# Patient Record
Sex: Male | Born: 2005 | Race: White | Hispanic: No | Marital: Single
Health system: Southern US, Community
[De-identification: ages and names within clinical notes are randomized; demographics above are authoritative.]

## PROBLEM LIST (undated history)

## (undated) DIAGNOSIS — H6690 Otitis media, unspecified, unspecified ear: Secondary | ICD-10-CM

## (undated) DIAGNOSIS — R1110 Vomiting, unspecified: Secondary | ICD-10-CM

## (undated) DIAGNOSIS — F988 Other specified behavioral and emotional disorders with onset usually occurring in childhood and adolescence: Secondary | ICD-10-CM

## (undated) DIAGNOSIS — R6250 Unspecified lack of expected normal physiological development in childhood: Secondary | ICD-10-CM

## (undated) DIAGNOSIS — T7840XA Allergy, unspecified, initial encounter: Secondary | ICD-10-CM

## (undated) HISTORY — DX: Vomiting, unspecified: R11.10

## (undated) HISTORY — DX: Allergy, unspecified, initial encounter: T78.40XA

## (undated) HISTORY — DX: Otitis media, unspecified, unspecified ear: H66.90

## (undated) HISTORY — DX: Unspecified lack of expected normal physiological development in childhood: R62.50

---
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
## 2023-04-25 ENCOUNTER — Ambulatory Visit (INDEPENDENT_AMBULATORY_CARE_PROVIDER_SITE_OTHER): Payer: Medicaid Other

## 2023-04-25 ENCOUNTER — Encounter (HOSPITAL_COMMUNITY): Payer: Self-pay

## 2023-04-25 ENCOUNTER — Ambulatory Visit (HOSPITAL_COMMUNITY)
Admission: EM | Admit: 2023-04-25 | Discharge: 2023-04-25 | Disposition: A | Discharge status: Home / Self Care | Payer: Medicaid Other | Attending: Internal Medicine | Admitting: Internal Medicine

## 2023-04-25 DIAGNOSIS — M25511 Pain in right shoulder: Secondary | ICD-10-CM

## 2023-04-25 NOTE — Discharge Instructions (Addendum)
X-ray of the right shoulder ordered and radiology will read the x-ray today but on brief evaluation there does not appear to be any gross abnormalities.  We cannot rule out a tendon or ligament injury and given the decreased range of motion we will use a arm sling and have you follow-up with orthopedics.  Avoid heavy activity with the right arm until orthopedics has cleared you.  Return to urgent care or PCP if symptoms worsen or fail to resolve.

## 2023-04-25 NOTE — ED Triage Notes (Signed)
Patient states he was playing with his sister on the monkey bars a week ago. Patient states he heard his right shoulder pop.  Patient states he hs pain when trying to lift his right arm up.   Patient states he took Ibuprofen, ice and heat with no relief.

## 2023-04-25 NOTE — ED Provider Notes (Signed)
[facility]    CSN: [id number] Arrival date & time: 04/25/23  1210      History   Chief Complaint Chief Complaint  Patient presents with   Shoulder Injury    HPI Terrence [name] is a 17 y.o. adult.   17 year old male who presents to urgent care with complaints of right shoulder pain and decreased range of motion.  He reports about a week ago he was helping with some bars and his arm pulled and twisted causing it to pop.  He immediately developed pain along the anterior and posterior aspects and since then has had decreased range of motion especially with abduction.  He denies any tingling or numbness in the fingers.  He denies any decreased grip strength.  He did not suffer any other injuries.   Shoulder Injury Pertinent negatives include no chest pain, no abdominal pain and no shortness of breath.    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 No pertinent surgical history.  OB History   No obstetric history on file.      Home Medications    Prior to Admission medications   Medication Sig Start Date End Date Taking? Authorizing Provider  lisdexamfetamine (VYVANSE) 40 MG capsule Take 1 capsule (40 mg total) by mouth daily with breakfast. 07/24/19   Leatha Gilding, MD  lisdexamfetamine (VYVANSE) 40 MG capsule Take 1 capsule (40 mg total) by mouth  daily with breakfast. 01/10/20   Leatha Gilding, MD  lisdexamfetamine (VYVANSE) 40 MG capsule Take 1 capsule (40 mg total) by mouth daily with breakfast. 01/10/20   Leatha Gilding, MD  ondansetron (ZOFRAN-ODT) 4 MG disintegrating tablet Take 1 tablet (4 mg total) by mouth every 8 (eight) hours as needed for up to 10 doses for nausea or vomiting. Patient not taking: Reported on 09/22/2018 08/10/18   [place], [place], MD    Family History Family History  Problem Relation Age of Onset   Kidney disease Maternal Grandmother    Hypertension Maternal Grandmother    Hyperlipidemia Maternal Grandmother    Learning disabilities Paternal Uncle    Mental retardation Paternal Uncle     Social History Social History   Tobacco Use   Smoking status: Passive Smoke Exposure - Never Smoker   Smokeless tobacco: Never   Tobacco comments:    mom smokes outside   Vaping Use   Vaping status: Every Day   Substances: Nicotine, Flavoring  Substance Use Topics   Alcohol use: No    Alcohol/week: 0.0 standard drinks of alcohol   Drug use: No     Allergies   Metadate cd [methylphenidate hcl]   Review of Systems Review of Systems  Constitutional:  Negative for chills and fever.  HENT:  Negative for ear pain and sore throat.   Eyes:  Negative for pain and visual  disturbance.  Respiratory:  Negative for cough and shortness of breath.   Cardiovascular:  Negative for chest pain and palpitations.  Gastrointestinal:  Negative for abdominal pain and vomiting.  Genitourinary:  Negative for dysuria and hematuria.  Musculoskeletal:  Negative for arthralgias and back pain.       Right shoulder pain with decreased range of motion  Skin:  Negative for color change and rash.  Neurological:  Negative for seizures and syncope.  All other systems reviewed and are negative.    Physical Exam Triage Vital Signs ED Triage Vitals  Encounter Vitals Group     BP 04/25/23 1257 119/74     Systolic BP Percentile --       Diastolic BP Percentile --      Pulse Rate 04/25/23 1257 77     Resp 04/25/23 1257 16     Temp 04/25/23 1257 98 F (36.7 C)     Temp Source 04/25/23 1257 Oral     SpO2 04/25/23 1257 97 %     Weight --      Height --      Head Circumference --      Peak Flow --      Pain Score 04/25/23 1256 6     Pain Loc --      Pain Education --      Exclude from Growth Chart --    No data found.  Updated Vital Signs BP 119/74 (BP Location: Left Arm)   Pulse 77   Temp 98 F (36.7 C) (Oral)   Resp 16   SpO2 97%   Visual Acuity Right Eye Distance:   Left Eye Distance:   Bilateral Distance:    Right Eye Near:   Left Eye Near:    Bilateral Near:     Physical Exam Vitals and nursing note reviewed.  Constitutional:      General: He is not in acute distress.    Appearance: He is well-developed.  HENT:     Head: Normocephalic and atraumatic.  Eyes:     Conjunctiva/sclera: Conjunctivae normal.  Cardiovascular:     Rate and Rhythm: Normal rate and regular rhythm.     Heart sounds: No murmur heard. Pulmonary:     Effort: Pulmonary effort is normal. No respiratory distress.     Breath sounds: Normal breath sounds.  Abdominal:     Palpations: Abdomen is soft.     Tenderness: There is no abdominal tenderness.  Musculoskeletal:        General: No swelling.     Right shoulder: Tenderness and bony tenderness present. No deformity or crepitus. Decreased range of motion (Especially with rotation and abduction). Normal strength. Normal pulse.     Cervical back: Neck supple.  Skin:    General: Skin is warm and dry.     Capillary Refill: Capillary refill takes less than 2 seconds.  Neurological:     General: No focal deficit present.     Mental Status: He is alert and oriented to person, place, and time.  Psychiatric:        Mood and Affect: Mood normal.      UC Treatments / Results  Labs (all labs ordered are listed, but only abnormal results are displayed) Labs Reviewed - No data  to display  EKG   Radiology No results found.  Procedures Procedures (including critical care time)  Medications Ordered in UC Medications - No data to display  Initial Impression / Assessment and Plan / UC  Course  I have reviewed the triage vital signs and the nursing notes.  Pertinent labs & imaging results that were available during my care of the patient were reviewed by me and considered in my medical decision making (see chart for details).     Acute pain of right shoulder   X-ray of the right shoulder ordered and radiology will read the x-ray today but on brief evaluation there does not appear to be any gross abnormalities.  We cannot rule out a tendon or ligament injury and given the decreased range of motion we will use a arm sling and have you follow-up with orthopedics.  Avoid heavy activity with the right arm until orthopedics has cleared you.  Return to urgent care or PCP if symptoms worsen or fail to resolve.    Final Clinical Impressions(s) / UC Diagnoses   Final diagnoses:  None   Discharge Instructions   None    ED Prescriptions   None    PDMP not reviewed this encounter.   Landis Martins, [place] 04/25/23 1411

## 2023-10-25 ENCOUNTER — Ambulatory Visit (HOSPITAL_COMMUNITY)
Admission: EM | Admit: 2023-10-25 | Discharge: 2023-10-25 | Disposition: A | Discharge status: Home / Self Care | Attending: Physician Assistant | Admitting: Physician Assistant

## 2023-10-25 ENCOUNTER — Encounter (HOSPITAL_COMMUNITY): Payer: Self-pay

## 2023-10-25 DIAGNOSIS — J302 Other seasonal allergic rhinitis: Secondary | ICD-10-CM | POA: Diagnosis not present

## 2023-10-25 DIAGNOSIS — J45909 Unspecified asthma, uncomplicated: Secondary | ICD-10-CM

## 2023-10-25 MED ORDER — ALBUTEROL SULFATE HFA 108 (90 BASE) MCG/ACT IN AERS: 1.0000 | INHALATION_SPRAY | Freq: Four times a day (QID) | RESPIRATORY_TRACT | 0 refills | Status: AC | PRN

## 2023-10-25 MED ORDER — SPACER/AERO-HOLDING CHAMBERS DEVI: 1.0000 | 0 refills | Status: AC | PRN

## 2023-10-25 NOTE — Discharge Instructions (Addendum)
 Based on your symptoms today I believe that you likely have allergies I have attached some information for you to review to help reduce your contact with triggers and reduce symptom burden I recommend the following at this time:  Start taking a second generation antihistamine such as Allegra, Claritin, Zyrtec, Xyzal, etc (the generics of these medications are typically just as effective and less expensive). If you are having trouble finding them, ask the pharmacist to point them out for you.  I also recommend using lubricating eye drops like Blink tears or Rephresh eye drops to help with eye symptoms You can try antihistamine eye drops such as ketotifen or Olopatadine drops twice per day to help with itching.  I also recommend using Flonase nasal spray to help with sinus congestion and runny nose Please be consistent with your medications as these can take several weeks to fully kick in and provide relief I also recommend watching the air-quality forecast and take note of high pollen days to help reduce your symptoms If you think other environmental allergens are causing your symptoms (such as mold, pet dander, fragrances, detergents, etc.) please try to identify them and remove them from your daily life. This can mean cleaning your home, bedding, clothing and other aspects of your environment a bit more often or with different products that are appropriate for allergies.   I have also sent in an albuterol inhaler for you to take to assist with your breathing/ coughing. This is typically what is called a "rescue inhaler" and you can use it when you have trouble breathing or can't stop coughing up to every 6 hours.  If you are having to use it more often than twice per day, every day for more than 2 weeks please let us  know as we may have to add a controller inhaler to your regimen until your symptoms reduce in severity.   If at any point you start to develop significant trouble breathing, shortness of  breath, vision changes, swelling around the eyes, fever or chills that are not responding to over-the-counter medications please go to the emergency room as these could be signs of a medical emergency.

## 2023-10-25 NOTE — ED Triage Notes (Signed)
 Cough x 3 days. Patient has taking allergy medication.

## 2023-10-25 NOTE — ED Provider Notes (Signed)
 [facility]    CSN: [id number] Arrival date & time: 10/25/23  8119      History   Chief Complaint Chief Complaint  Patient presents with   Cough    HPI Terrence [name] is a 18 y.o. adult.   HPI  He reports productive coughing with mucus that tastes like blood  He also reports SOB and feels like he is gasping for air  He states these symptoms have been ongoing for about 3 days   He reports previous hx of seasonal allergies but denies hx of breathing issues like asthma   Interventions: he has been taking Benadryl and another antihistamine for the past week but this has not provided relief   Past Medical History:  Diagnosis Date   ADD (attention deficit disorder)    Allergy    allergic rhinitis   Developmental delay    Otitis media    Referred to ENT in 2012 (PCP [facility] [place])   Vomiting 09/18/2009   recurrent vomiting of unclear cause - referred to Peds GI (Dr Fulton Job)     Patient Active Problem List   Diagnosis Date Noted   Learning disability 02/09/2015   ADHD (attention deficit hyperactivity disorder), 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 No pertinent surgical history.  OB History   No obstetric history on file.      Home Medications    Prior to Admission medications   Medication Sig Start Date End Date Taking? Authorizing Provider  albuterol (VENTOLIN HFA) 108 (90 Base) MCG/ACT inhaler Inhale 1-2 puffs into the lungs every 6 (six) hours as needed for wheezing or shortness of breath. 10/25/23  Yes Adessa Primiano E, PA-C  Spacer/Aero-Holding Chambers DEVI 1 Device by Does not apply route as needed. 10/25/23  Yes Mattilyn Crites E, PA-C  lisdexamfetamine (VYVANSE) 40 MG capsule Take 1 capsule (40 mg total) by mouth daily with breakfast. 07/24/19   Edson Graces,  MD  lisdexamfetamine (VYVANSE) 40 MG capsule Take 1 capsule (40 mg total) by mouth daily with breakfast. 01/10/20   Edson Graces, MD  lisdexamfetamine (VYVANSE) 40 MG capsule Take 1 capsule (40 mg total) by mouth daily with breakfast. 01/10/20   Edson Graces, MD  ondansetron (ZOFRAN-ODT) 4 MG disintegrating tablet Take 1 tablet (4 mg total) by mouth every 8 (eight) hours as needed for up to 10 doses for nausea or vomiting. Patient not taking: Reported on 09/22/2018 08/10/18   Hanvey, [place], MD    Family History Family History  Problem Relation Age of Onset   Kidney disease Maternal Grandmother    Hypertension Maternal Grandmother    Hyperlipidemia Maternal Grandmother    Learning disabilities Paternal Uncle    Mental retardation Paternal Uncle     Social History Social History   Tobacco Use   Smoking status: Passive Smoke Exposure - Never Smoker   Smokeless tobacco: Never   Tobacco comments:    mom smokes outside   Vaping Use   Vaping status: Every Day   Substances: Nicotine, Flavoring  Substance Use Topics   Alcohol use: No    Alcohol/week: 0.0 standard drinks of alcohol   Drug use: No     Allergies   Metadate cd [methylphenidate hcl]   Review of Systems Review of Systems  Constitutional:  Negative for chills and  fever.  HENT:  Positive for congestion, ear pain (left ear pain), postnasal drip and rhinorrhea. Negative for sore throat.   Eyes:  Positive for pain, discharge and itching.  Respiratory:  Positive for cough and shortness of breath.   Gastrointestinal:  Positive for diarrhea and vomiting. Negative for abdominal pain and nausea.  Musculoskeletal:  Negative for myalgias.  Neurological:  Negative for dizziness, light-headedness and headaches.     Physical Exam Triage Vital Signs ED Triage Vitals  Encounter Vitals Group     BP 10/25/23 0856 113/78     Systolic BP Percentile --      Diastolic BP Percentile --      Pulse Rate 10/25/23 0857 71     Resp  10/25/23 0856 18     Temp 10/25/23 0857 98.1 F (36.7 C)     Temp Source 10/25/23 0857 Oral     SpO2 --      Weight --      Height --      Head Circumference --      Peak Flow --      Pain Score --      Pain Loc --      Pain Education --      Exclude from Growth Chart --    No data found.  Updated Vital Signs BP 113/78   Pulse 71   Temp 98.1 F (36.7 C) (Oral)   Resp 18   Visual Acuity Right Eye Distance:   Left Eye Distance:   Bilateral Distance:    Right Eye Near:   Left Eye Near:    Bilateral Near:     Physical Exam Vitals reviewed.  Constitutional:      General: He is awake.     Appearance: Normal appearance. He is well-developed and well-groomed.  HENT:     Head: Normocephalic and atraumatic.     Right Ear: Hearing, tympanic membrane and ear canal normal.     Left Ear: Hearing, tympanic membrane and ear canal normal.     Mouth/Throat:     Lips: Pink.     Mouth: Mucous membranes are moist.     Pharynx: Oropharynx is clear. Uvula midline. No pharyngeal swelling, oropharyngeal exudate, posterior oropharyngeal erythema, uvula swelling or postnasal drip.  Eyes:     General: Lids are normal. Gaze aligned appropriately.     Extraocular Movements: Extraocular movements intact.     Conjunctiva/sclera:     Right eye: Right conjunctiva is injected.     Left eye: Left conjunctiva is injected.  Cardiovascular:     Rate and Rhythm: Normal rate and regular rhythm.     Pulses: Normal pulses.          Radial pulses are 2+ on the right side and 2+ on the left side.     Heart sounds: Normal heart sounds. No murmur heard.    No friction rub. No gallop.  Pulmonary:     Effort: Pulmonary effort is normal.     Breath sounds: Normal breath sounds. No decreased air movement. No decreased breath sounds, wheezing, rhonchi or rales.  Musculoskeletal:     Cervical back: Normal range of motion and neck supple.  Lymphadenopathy:     Head:     Right side of head: No submental,  submandibular or preauricular adenopathy.     Left side of head: No submental, submandibular or preauricular adenopathy.     Cervical:     Right cervical: No superficial cervical adenopathy.  Left cervical: No superficial cervical adenopathy.     Upper Body:     Right upper body: No supraclavicular adenopathy.     Left upper body: No supraclavicular adenopathy.  Neurological:     Mental Status: He is alert.  Psychiatric:        Behavior: Behavior is cooperative.      UC Treatments / Results  Labs (all labs ordered are listed, but only abnormal results are displayed) Labs Reviewed - No data to display  EKG   Radiology No results found.  Procedures Procedures (including critical care time)  Medications Ordered in UC Medications - No data to display  Initial Impression / Assessment and Plan / UC Course  I have reviewed the triage vital signs and the nursing notes.  Pertinent labs & imaging results that were available during my care of the patient were reviewed by me and considered in my medical decision making (see chart for details).      Final Clinical Impressions(s) / UC Diagnoses   Final diagnoses:  Seasonal allergic rhinitis, unspecified trigger  Asthma due to seasonal allergies   Patient presents today with concerns for productive cough, nasal congestion, runny nose, itchy and watery eyes and shortness of breath.  He reports that the symptoms have been ongoing for about 3 days despite taking Benadryl last week as well as an unidentified antihistamine given to him by family member.  He reports previous history of seasonal allergies.  Physical exam and vitals are overall reassuring today.  At this time I suspect likely seasonal allergies, allergic rhinitis and potential allergy induced asthma.  Reviewed with him that it is best to take antihistamines once per day consistently for several weeks to provide adequate relief.  Will send in albuterol inhaler as well as  spacer.  Albuterol inhaler use education provided during appointment.  Also recommend starting Flonase nasal spray and antihistamine eyedrops to assist with symptoms.  ED and return precautions reviewed and provided in after visit summary.  Follow-up as needed.    Discharge Instructions      Based on your symptoms today I believe that you likely have allergies I have attached some information for you to review to help reduce your contact with triggers and reduce symptom burden I recommend the following at this time:  Start taking a second generation antihistamine such as Allegra, Claritin, Zyrtec, Xyzal, etc (the generics of these medications are typically just as effective and less expensive). If you are having trouble finding them, ask the pharmacist to point them out for you.  I also recommend using lubricating eye drops like Blink tears or Rephresh eye drops to help with eye symptoms You can try antihistamine eye drops such as ketotifen or Olopatadine drops twice per day to help with itching.  I also recommend using Flonase nasal spray to help with sinus congestion and runny nose Please be consistent with your medications as these can take several weeks to fully kick in and provide relief I also recommend watching the air-quality forecast and take note of high pollen days to help reduce your symptoms If you think other environmental allergens are causing your symptoms (such as mold, pet dander, fragrances, detergents, etc.) please try to identify them and remove them from your daily life. This can mean cleaning your home, bedding, clothing and other aspects of your environment a bit more often or with different products that are appropriate for allergies.   I have also sent in an albuterol inhaler for you  to take to assist with your breathing/ coughing. This is typically what is called a "rescue inhaler" and you can use it when you have trouble breathing or can't stop coughing up to every 6  hours.  If you are having to use it more often than twice per day, every day for more than 2 weeks please let us  know as we may have to add a controller inhaler to your regimen until your symptoms reduce in severity.   If at any point you start to develop significant trouble breathing, shortness of breath, vision changes, swelling around the eyes, fever or chills that are not responding to over-the-counter medications please go to the emergency room as these could be signs of a medical emergency.      ED Prescriptions     Medication Sig Dispense Auth. Provider   albuterol (VENTOLIN HFA) 108 (90 Base) MCG/ACT inhaler Inhale 1-2 puffs into the lungs every 6 (six) hours as needed for wheezing or shortness of breath. 8 g Sevannah Madia E, PA-C   Spacer/Aero-Holding Chambers DEVI 1 Device by Does not apply route as needed. 2 each Romelle Reiley E, PA-C      PDMP not reviewed this encounter.   Ronelle Smallman E, PA-C 10/25/23 8295

## 2023-11-06 ENCOUNTER — Other Ambulatory Visit: Payer: Self-pay

## 2023-11-06 ENCOUNTER — Ambulatory Visit (HOSPITAL_COMMUNITY)
Admission: RE | Admit: 2023-11-06 | Discharge: 2023-11-06 | Disposition: A | Discharge status: Home / Self Care | Payer: Self-pay | Source: Ambulatory Visit

## 2023-11-06 ENCOUNTER — Encounter (HOSPITAL_COMMUNITY): Payer: Self-pay

## 2023-11-06 ENCOUNTER — Ambulatory Visit (INDEPENDENT_AMBULATORY_CARE_PROVIDER_SITE_OTHER)

## 2023-11-06 VITALS — BP 130/85 | HR 100 | Temp 98.0°F | Resp 18

## 2023-11-06 DIAGNOSIS — J069 Acute upper respiratory infection, unspecified: Secondary | ICD-10-CM

## 2023-11-06 DIAGNOSIS — R053 Chronic cough: Secondary | ICD-10-CM

## 2023-11-06 LAB — POC SARS CORONAVIRUS 2 AG -  ED: SARS Coronavirus 2 Ag: NEGATIVE

## 2023-11-06 MED ORDER — ONDANSETRON 4 MG PO TBDP
ORAL_TABLET | ORAL | Status: AC
  Filled 2023-11-06: qty 1

## 2023-11-06 MED ORDER — AZELASTINE HCL 0.1 % NA SOLN: 1.0000 | Freq: Two times a day (BID) | NASAL | 0 refills | Status: AC

## 2023-11-06 MED ORDER — PROMETHAZINE-DM 6.25-15 MG/5ML PO SYRP: 5.0000 mL | ORAL_SOLUTION | Freq: Four times a day (QID) | ORAL | 0 refills | Status: AC | PRN

## 2023-11-06 MED ORDER — ONDANSETRON 4 MG PO TBDP: 4.0000 mg | ORAL_TABLET | Freq: Three times a day (TID) | ORAL | 0 refills | Status: AC | PRN

## 2023-11-06 MED ORDER — ONDANSETRON 4 MG PO TBDP
4.0000 mg | ORAL_TABLET | Freq: Once | ORAL | Status: AC
  Administered 2023-11-06: 4 mg via ORAL

## 2023-11-06 NOTE — Discharge Instructions (Signed)
  1. Acute upper respiratory infection (Primary) - POC SARS Coronavirus 2 Ag-ED - Nasal Swab completed UC is negative for COVID-19 - ondansetron  (ZOFRAN -ODT) disintegrating tablet 4 mg - DG Chest 2 View x-ray performed in UC shows no acute cardiopulmonary processes, no sign of consolidation or pneumonia. - ondansetron  (ZOFRAN -ODT) 4 MG disintegrating tablet; Take 1 tablet (4 mg total) by mouth every 8 (eight) hours as needed for nausea or vomiting.  Dispense: 20 tablet; Refill: 0 - azelastine (ASTELIN) 0.1 % nasal spray; Place 1 spray into both nostrils 2 (two) times daily. Use in each nostril as directed  Dispense: 30 mL; Refill: 0 - promethazine-dextromethorphan (PROMETHAZINE-DM) 6.25-15 MG/5ML syrup; Take 5 mLs by mouth 4 (four) times daily as needed for cough.  Dispense: 473 mL; Refill: 0 -Continue to monitor symptoms for any change in severity if there is any escalation of current symptoms or development of new symptoms follow-up in ER for further evaluation and management.

## 2023-11-06 NOTE — ED Triage Notes (Signed)
 Pt reports persistent cough, running nose and sneezing since Friday.

## 2023-11-06 NOTE — ED Provider Notes (Signed)
 [facility]  Note:  This document was prepared using Dragon voice recognition software and may include unintentional dictation errors.  MRN: [id number] DOB: August 12, 2005  Subjective:   Davonne Dehner is a 18 y.o. adult presenting for nausea, persisting cough, nasal congestion since Friday.  Patient was treated for seasonal allergies approximately 2 to 3 weeks ago in urgent care and states that symptoms of nasal congestion and cough have been consistent since that time but have worsened since Friday.  Patient states he has taken daily antihistamine yesterday but did not get any relief from medication.  Patient was  no advised to start taking daily antihistamine 2 weeks ago when seen in urgent care.  No chest pain, shortness of breath, abdominal pain, weakness, dizziness.  No current facility-administered medications for this encounter.  Current Outpatient Medications:    azelastine (ASTELIN) 0.1 % nasal spray, Place 1 spray into both nostrils 2 (two) times daily. Use in each nostril as directed, Disp: 30 mL, Rfl: 0   ondansetron  (ZOFRAN -ODT) 4 MG disintegrating tablet, Take 1 tablet (4 mg total) by mouth every 8 (eight) hours as needed for nausea or vomiting., Disp: 20 tablet, Rfl: 0   promethazine-dextromethorphan (PROMETHAZINE-DM) 6.25-15 MG/5ML syrup, Take 5 mLs by mouth 4 (four) times daily as needed for cough., Disp: 473 mL, Rfl: 0   albuterol  (VENTOLIN  HFA) 108 (90 Base) MCG/ACT inhaler, Inhale 1-2 puffs into the lungs every 6 (six) hours as needed for wheezing or shortness of breath., Disp: 8 g, Rfl: 0   lisdexamfetamine (VYVANSE ) 40 MG capsule, Take 1 capsule (40 mg total) by mouth daily with breakfast., Disp: 30 capsule, Rfl: 0   lisdexamfetamine (VYVANSE ) 40 MG capsule, Take 1 capsule (40 mg total) by mouth daily with breakfast., Disp: 31 capsule, Rfl: 0   lisdexamfetamine (VYVANSE ) 40 MG capsule, Take 1 capsule (40 mg total) by mouth daily with breakfast., Disp: 31  capsule, Rfl: 0   Spacer/Aero-Holding Chambers DEVI, 1 Device by Does not apply route as needed., Disp: 2 each, Rfl: 0   Allergies  Allergen Reactions   Metadate  Cd [Methylphenidate  Hcl] Hives and Swelling    Past Medical History:  Diagnosis Date   ADD (attention deficit disorder)    Allergy    allergic rhinitis   Developmental delay    Otitis media    Referred to ENT in 2012 (PCP [facility] [place])   Vomiting 09/18/2009   recurrent vomiting of unclear cause - referred to Peds GI (Dr Fulton Job)      History reviewed. No pertinent surgical history.  Family History  Problem Relation Age of Onset   Kidney disease Maternal Grandmother    Hypertension Maternal Grandmother    Hyperlipidemia Maternal Grandmother    Learning disabilities Paternal Uncle    Mental retardation Paternal Uncle     Social History   Tobacco Use   Smoking status: Passive Smoke Exposure - Never Smoker   Smokeless tobacco: Never   Tobacco comments:    mom smokes outside   Vaping Use   Vaping status: Every Day   Substances: Nicotine, Flavoring  Substance Use Topics   Alcohol use: No    Alcohol/week: 0.0 standard drinks of alcohol   Drug use: No    ROS Refer to HPI for ROS details.  Objective:   Vitals: BP 130/85 (BP Location: Right Arm)   Pulse 100   Temp 98 F (36.7 C) (Oral)   Resp 18   SpO2 98%   Physical Exam Vitals  and nursing note reviewed.  Constitutional:      General: He is not in acute distress.    Appearance: Normal appearance. He is well-developed. He is not ill-appearing or toxic-appearing.  HENT:     Head: Normocephalic and atraumatic.     Nose: Congestion and rhinorrhea present.     Mouth/Throat:     Mouth: Mucous membranes are moist.     Pharynx: Oropharynx is clear.  Eyes:     General:        Right eye: No discharge.        Left eye: No discharge.     Extraocular Movements: Extraocular movements intact.     Conjunctiva/sclera: Conjunctivae  normal.  Cardiovascular:     Rate and Rhythm: Normal rate.  Pulmonary:     Effort: Pulmonary effort is normal. No respiratory distress.     Breath sounds: No stridor. No wheezing, rhonchi or rales.  Chest:     Chest wall: No tenderness.  Musculoskeletal:        General: Normal range of motion.  Skin:    General: Skin is warm and dry.  Neurological:     General: No focal deficit present.     Mental Status: He is alert and oriented to person, place, and time.  Psychiatric:        Mood and Affect: Mood normal.        Behavior: Behavior normal.     Procedures  Results for orders placed or performed during the hospital encounter of 11/06/23 (from the past 24 hours)  POC SARS Coronavirus 2 Ag-ED - Nasal Swab     Status: None   Collection Time: 11/06/23 12:37 PM  Result Value Ref Range   SARS Coronavirus 2 Ag Negative Negative    No results found.   Assessment and Plan :     Discharge Instructions       1. Acute upper respiratory infection (Primary) - POC SARS Coronavirus 2 Ag-ED - Nasal Swab completed UC is negative for COVID-19 - ondansetron  (ZOFRAN -ODT) disintegrating tablet 4 mg - DG Chest 2 View x-ray performed in UC shows no acute cardiopulmonary processes, no sign of consolidation or pneumonia. - ondansetron  (ZOFRAN -ODT) 4 MG disintegrating tablet; Take 1 tablet (4 mg total) by mouth every 8 (eight) hours as needed for nausea or vomiting.  Dispense: 20 tablet; Refill: 0 - azelastine (ASTELIN) 0.1 % nasal spray; Place 1 spray into both nostrils 2 (two) times daily. Use in each nostril as directed  Dispense: 30 mL; Refill: 0 - promethazine-dextromethorphan (PROMETHAZINE-DM) 6.25-15 MG/5ML syrup; Take 5 mLs by mouth 4 (four) times daily as needed for cough.  Dispense: 473 mL; Refill: 0 -Continue to monitor symptoms for any change in severity if there is any escalation of current symptoms or development of new symptoms follow-up in ER for further evaluation and  management.      Tashanna Dolin B Kynzee Devinney   Pacey Altizer, [place] B, [place] 11/06/23 1246

## 2024-03-30 ENCOUNTER — Ambulatory Visit (HOSPITAL_COMMUNITY): Payer: Self-pay

## 2024-05-13 ENCOUNTER — Other Ambulatory Visit: Payer: Self-pay

## 2024-05-13 ENCOUNTER — Emergency Department (HOSPITAL_BASED_OUTPATIENT_CLINIC_OR_DEPARTMENT_OTHER)
Admission: EM | Admit: 2024-05-13 | Discharge: 2024-05-13 | Disposition: A | Discharge status: Home / Self Care | Attending: Emergency Medicine | Admitting: Emergency Medicine

## 2024-05-13 ENCOUNTER — Encounter (HOSPITAL_BASED_OUTPATIENT_CLINIC_OR_DEPARTMENT_OTHER): Payer: Self-pay

## 2024-05-13 ENCOUNTER — Emergency Department (HOSPITAL_BASED_OUTPATIENT_CLINIC_OR_DEPARTMENT_OTHER)

## 2024-05-13 DIAGNOSIS — M79605 Pain in left leg: Secondary | ICD-10-CM

## 2024-05-13 DIAGNOSIS — W19XXXA Unspecified fall, initial encounter: Secondary | ICD-10-CM | POA: Insufficient documentation

## 2024-05-13 DIAGNOSIS — Y9367 Activity, basketball: Secondary | ICD-10-CM | POA: Diagnosis not present

## 2024-05-13 DIAGNOSIS — M79652 Pain in left thigh: Secondary | ICD-10-CM | POA: Insufficient documentation

## 2024-05-13 MED ORDER — IBUPROFEN 800 MG PO TABS: 800.0000 mg | ORAL_TABLET | Freq: Three times a day (TID) | ORAL | 0 refills | Status: AC | PRN

## 2024-05-13 MED ORDER — IBUPROFEN 800 MG PO TABS
800.0000 mg | ORAL_TABLET | Freq: Once | ORAL | Status: AC
  Administered 2024-05-13: 800 mg via ORAL
  Filled 2024-05-13: qty 1

## 2024-05-13 NOTE — ED Triage Notes (Signed)
 Playing basketball tonight and fell down on top on a rock injuring left upper leg.   Ambulatory.

## 2024-05-13 NOTE — ED Provider Notes (Signed)
 [place] EMERGENCY DEPARTMENT AT [facility] Provider Note   CSN: [id number] Arrival date & time: 05/13/24  8048     Patient presents with: Leg Injury   Terrence [name] is a 18 y.o. adult.   Patient is a 18 year old male presenting for injury to left leg.  Patient states he was playing basketball when he stopped and a rock and fell to his left side injuring his left upper leg.  He admits to pain on the anterior left upper thigh.  He denies sensation or motor dysfunction.  He denies bruising or bleeding.  No wounds.  Able to bear weight.  The history is provided by the patient. No language interpreter was used.       Prior to Admission medications   Medication Sig Start Date End Date Taking? Authorizing Provider  ibuprofen  (ADVIL ) 800 MG tablet Take 1 tablet (800 mg total) by mouth every 8 (eight) hours as needed for moderate pain (pain score 4-6). 05/13/24  Yes Elnor Hila P, DO  albuterol  (VENTOLIN  HFA) 108 (90 Base) MCG/ACT inhaler Inhale 1-2 puffs into the lungs every 6 (six) hours as needed for wheezing or shortness of breath. 10/25/23   Mecum, Erin E, PA-C  azelastine  (ASTELIN ) 0.1 % nasal spray Place 1 spray into both nostrils 2 (two) times daily. Use in each nostril as directed 11/06/23   Reddick, Johnathan B, NP  lisdexamfetamine (VYVANSE ) 40 MG capsule Take 1 capsule (40 mg total) by mouth daily with breakfast. 07/24/19   Butch Cheryl RAMAN, MD  lisdexamfetamine (VYVANSE ) 40 MG capsule Take 1 capsule (40 mg total) by mouth daily with breakfast. 01/10/20   Butch Cheryl RAMAN, MD  lisdexamfetamine (VYVANSE ) 40 MG capsule Take 1 capsule (40 mg total) by mouth daily with breakfast. 01/10/20   Butch Cheryl RAMAN, MD  ondansetron  (ZOFRAN -ODT) 4 MG disintegrating tablet Take 1 tablet (4 mg total) by mouth every 8 (eight) hours as needed for nausea or vomiting. 11/06/23   Reddick, Johnathan B, NP  promethazine -dextromethorphan (PROMETHAZINE -DM) 6.25-15 MG/5ML syrup Take 5 mLs by mouth 4 (four)  times daily as needed for cough. 11/06/23   Reddick, Johnathan B, NP  Spacer/Aero-Holding [place] DEVI 1 Device by Does not apply route as needed. 10/25/23   Mecum, Erin E, PA-C    Allergies: Metadate  cd [methylphenidate  hcl]    Review of Systems  Musculoskeletal:  Negative for back pain.  Skin:  Positive for wound. Negative for color change.  Neurological:  Negative for weakness and numbness.    Updated Vital Signs BP 128/88 (BP Location: Right Arm)   Pulse (!) 111   Temp 97.9 F (36.6 C) (Oral)   Resp 18   Ht 5' 10 (1.778 m)   Wt 103.4 kg   SpO2 97%   BMI 32.71 kg/m   Physical Exam Vitals and nursing note reviewed.  Constitutional:      General: He is not in acute distress.    Appearance: He is well-developed.  HENT:     Head: Normocephalic and atraumatic.  Eyes:     Conjunctiva/sclera: Conjunctivae normal.  Cardiovascular:     Rate and Rhythm: Normal rate and regular rhythm.     Pulses:          Dorsalis pedis pulses are 2+ on the left side.     Heart sounds: No murmur heard. Pulmonary:     Effort: Pulmonary effort is normal. No respiratory distress.     Breath sounds: Normal breath sounds.  Abdominal:  Palpations: Abdomen is soft.     Tenderness: There is no abdominal tenderness.  Musculoskeletal:        General: No swelling.     Cervical back: Neck supple.     Right hip: Normal.     Left hip: Normal.     Right upper leg: Normal.     Left upper leg: Tenderness and bony tenderness present. No swelling, edema or deformity.     Right knee: Normal.     Left knee: Normal.     Right lower leg: Normal.     Left lower leg: Normal.     Right ankle: Normal.     Left ankle: Normal.     Right foot: Normal.     Left foot: Normal.       Legs:     Comments: Soft compartments.  Skin:    General: Skin is warm and dry.     Capillary Refill: Capillary refill takes less than 2 seconds.     Findings: No bruising or ecchymosis.     Comments: No ecchymosis or wounds   Neurological:     Mental Status: He is alert.     Sensory: Sensation is intact.     Motor: Motor function is intact.  Psychiatric:        Mood and Affect: Mood normal.     (all labs ordered are listed, but only abnormal results are displayed) Labs Reviewed - No data to display  EKG: None  Radiology: DG Femur Min 2 Views Left Result Date: 05/13/2024 CLINICAL DATA:  Status post fall. EXAM: LEFT FEMUR 2 VIEWS COMPARISON:  None Available. FINDINGS: There is no evidence of fracture or other focal bone lesions. Soft tissues are unremarkable. IMPRESSION: Negative. Electronically Signed   By: Suzen Dials M.D.   On: 05/13/2024 21:41     Procedures   Medications Ordered in the ED  ibuprofen  (ADVIL ) tablet 800 mg (800 mg Oral Given 05/13/24 2144)                                    Medical Decision Making Amount and/or Complexity of Data Reviewed Radiology: ordered.  Risk Prescription drug management.    18 year old male presenting for injury to left leg.  Patient is alert and oriented x 3, no acute distress, afebrile, so vital signs.  Tender to palpation of the anterior left lateral quadriceps.  No open wounds, bleeding, ecchymosis, etc.  Normal range of motion.  X-ray demonstrates no fractures.  Leg is neurovascularly intact.  Compartments.  Recommend follow-up outpatient with orthopedics if pain does not improve in the next 5 to 7 days.  Recommend rest, elevation, ice, NSAIDs.  Patient in no distress and overall condition improved here in the ED. Detailed discussions were had with the patient regarding current findings, and need for close f/u with PCP or on call doctor. The patient has been instructed to return immediately if the symptoms worsen in any way for re-evaluation. Patient verbalized understanding and is in agreement with current care plan. All questions answered prior to discharge.       Final diagnoses:  Pain of left lower extremity    ED Discharge Orders           Ordered    ibuprofen  (ADVIL ) 800 MG tablet  Every 8 hours PRN        05/13/24 2205  Elnor Bernarda SQUIBB, DO 05/13/24 2206

## 2024-05-13 NOTE — Discharge Instructions (Addendum)
 May return to work sooner if leg wound improved.

## 2024-07-18 ENCOUNTER — Emergency Department (HOSPITAL_COMMUNITY)
Admission: EM | Admit: 2024-07-18 | Discharge: 2024-07-18 | Discharge status: Against Medical Advice | Attending: Emergency Medicine | Admitting: Emergency Medicine

## 2024-07-18 ENCOUNTER — Encounter (HOSPITAL_BASED_OUTPATIENT_CLINIC_OR_DEPARTMENT_OTHER): Payer: Self-pay

## 2024-07-18 ENCOUNTER — Other Ambulatory Visit: Payer: Self-pay

## 2024-07-18 ENCOUNTER — Encounter (HOSPITAL_COMMUNITY): Payer: Self-pay

## 2024-07-18 DIAGNOSIS — H538 Other visual disturbances: Secondary | ICD-10-CM | POA: Insufficient documentation

## 2024-07-18 DIAGNOSIS — W890XXA Exposure to welding light (arc), initial encounter: Secondary | ICD-10-CM | POA: Diagnosis not present

## 2024-07-18 DIAGNOSIS — Z5321 Procedure and treatment not carried out due to patient leaving prior to being seen by health care provider: Secondary | ICD-10-CM | POA: Insufficient documentation

## 2024-07-18 DIAGNOSIS — H5713 Ocular pain, bilateral: Secondary | ICD-10-CM | POA: Insufficient documentation

## 2024-07-18 DIAGNOSIS — H5712 Ocular pain, left eye: Secondary | ICD-10-CM | POA: Diagnosis present

## 2024-07-18 MED ORDER — TETRACAINE HCL 0.5 % OP SOLN
2.0000 [drp] | Freq: Once | OPHTHALMIC | Status: AC
  Administered 2024-07-19: 2 [drp] via OPHTHALMIC
  Filled 2024-07-18: qty 4

## 2024-07-18 MED ORDER — FLUORESCEIN SODIUM 1 MG OP STRP
1.0000 | ORAL_STRIP | Freq: Once | OPHTHALMIC | Status: AC
  Administered 2024-07-19: 1 via OPHTHALMIC
  Filled 2024-07-18: qty 1

## 2024-07-18 NOTE — ED Provider Triage Note (Signed)
 Emergency Medicine Provider Triage Evaluation Note  Terrence [name] , a 19 y.o. adult  was evaluated in triage.  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  Review of Systems  Positive: Eye pain, blurred vision, redness Negative: Swelling, other injury  Physical Exam  BP 127/87 (BP Location: Right Arm)   Pulse 94   Temp 98.8 F (37.1 C) (Oral)   Resp 16   SpO2 97%  Gen:   Awake, no distress   Resp:  Normal effort  MSK:   Moves extremities without difficulty  Other:  Left eye erythematous and watering, no obvious foreign body noted on exam.  Medical Decision Making  Medically screening exam initiated at 8:36 PM.  Appropriate orders placed.  Terrence [name] was informed that the remainder of the evaluation will be completed by another provider, this initial triage assessment does not replace that evaluation, and the importance of remaining in the ED until their evaluation is complete.     Francis Ileana SAILOR, PA-C 07/18/24 2039

## 2024-07-18 NOTE — ED Notes (Signed)
 Pt presented to [facility] ED to be seen from [facility] ED.

## 2024-07-18 NOTE — ED Triage Notes (Signed)
 PT complaints of left eye pain. Pt was welding and spark hit him on the eye. Reports blurry vision on left eye, visible redness.

## 2024-07-18 NOTE — ED Triage Notes (Signed)
 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 L eye reddened, visibly irritated. Seen at [facility] for same, LWBS after triage

## 2024-07-19 ENCOUNTER — Emergency Department (HOSPITAL_BASED_OUTPATIENT_CLINIC_OR_DEPARTMENT_OTHER)
Admission: EM | Admit: 2024-07-19 | Discharge: 2024-07-19 | Disposition: A | Discharge status: Home / Self Care | Attending: Emergency Medicine | Admitting: Emergency Medicine

## 2024-07-19 DIAGNOSIS — H16139 Photokeratitis, unspecified eye: Secondary | ICD-10-CM

## 2024-07-19 NOTE — ED Provider Notes (Signed)
 " [place] EMERGENCY DEPARTMENT AT [facility] Provider Note   CSN: [id number] Arrival date & time: 07/18/24  2245     Patient presents with: No chief complaint on file.   Sayed Apostol is a 19 y.o. adult.   19 year old male who presents ER today with bilateral eye pain.  Patient states initially was his left.  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  Significant tearing.  Worse with light.  Blurry vision but he blinks a couple times it seems to improved.        Prior to Admission medications  Medication Sig Start Date End Date Taking? Authorizing Provider  albuterol  (VENTOLIN  HFA) 108 (90 Base) MCG/ACT inhaler Inhale 1-2 puffs into the lungs every 6 (six) hours as needed for wheezing or shortness of breath. 10/25/23   Mecum, Erin E, PA-C  azelastine  (ASTELIN ) 0.1 % nasal spray Place 1 spray into both nostrils 2 (two) times daily. Use in each nostril as directed 11/06/23   Reddick, Johnathan B, NP  ibuprofen  (ADVIL ) 800 MG tablet Take 1 tablet (800 mg total) by mouth every 8 (eight) hours as needed for moderate pain (pain score 4-6). 05/13/24   Elnor Bernarda SQUIBB, DO  lisdexamfetamine  (VYVANSE ) 40 MG capsule Take 1 capsule (40 mg total) by mouth daily with breakfast. 07/24/19   Butch Cheryl RAMAN, MD  lisdexamfetamine  (VYVANSE ) 40 MG capsule Take 1 capsule (40 mg total) by mouth daily with breakfast. 01/10/20   Butch Cheryl RAMAN, MD  lisdexamfetamine  (VYVANSE ) 40 MG capsule Take 1 capsule (40 mg total) by mouth daily with breakfast. 01/10/20   Butch Cheryl RAMAN, MD  ondansetron  (ZOFRAN -ODT) 4 MG disintegrating tablet Take 1 tablet (4 mg total) by mouth every 8 (eight) hours as needed for nausea or vomiting. 11/06/23   Reddick, Johnathan B, NP  promethazine -dextromethorphan (PROMETHAZINE -DM) 6.25-15 MG/5ML syrup Take 5 mLs by mouth 4 (four) times daily as needed for cough.  11/06/23   Reddick, Johnathan B, NP  Spacer/Aero-Holding Raguel DEVI 1 Device by Does not apply route as needed. 10/25/23   Mecum, Erin E, PA-C    Allergies: Metadate  cd [methylphenidate  hcl]    Review of Systems  Updated Vital Signs BP (!) 137/98 (BP Location: Left Arm)   Pulse 73   Temp 98.7 F (37.1 C) (Oral)   Resp 20   SpO2 99%   Physical Exam Vitals and nursing note reviewed.  Constitutional:      Appearance: He is well-developed.  HENT:     Head: Normocephalic and atraumatic.  Eyes:     Comments: EOMI, mild injection, no foreign bodies or abrasions on fluroscein staining.  Cardiovascular:     Rate and Rhythm: Normal rate.  Pulmonary:     Effort: Pulmonary effort is normal. No respiratory distress.  Abdominal:     General: There is no distension.  Musculoskeletal:        General: Normal range of motion.     Cervical back: Normal range of motion.  Neurological:     Mental Status: He is alert.     (all labs ordered are listed, but only abnormal results are displayed) Labs Reviewed - No data to display  EKG: None  Radiology: No results found.   Procedures   Medications Ordered in the ED  tetracaine  (PONTOCAINE) 0.5 % ophthalmic solution 2 drop (2 drops Left Eye Given  07/19/24 0217)  fluorescein  ophthalmic strip 1 strip (1 strip Left Eye Given 07/19/24 0217)                                    Medical Decision Making Risk Prescription drug management.   Suspect welders keratitis. Pain resolved with tetracaine  and no evidence of conjunctival or globe injury. Will refer to ophtho if not improving in 24 hours. Return here for any new or worsening symptoms.    Final diagnoses:  Welders' flash    ED Discharge Orders     None          Arlie Riker, Selinda, MD 07/19/24 [phone]  "
